# Patient Record
Sex: Female | Born: 1985 | Race: Black or African American | Hispanic: No | Marital: Married | Smoking: Current every day smoker
Health system: Southern US, Community
[De-identification: ages and names within clinical notes are randomized; demographics above are authoritative.]

## PROBLEM LIST (undated history)

## (undated) DIAGNOSIS — F32A Depression, unspecified: Secondary | ICD-10-CM

## (undated) DIAGNOSIS — F319 Bipolar disorder, unspecified: Secondary | ICD-10-CM

## (undated) DIAGNOSIS — F329 Major depressive disorder, single episode, unspecified: Secondary | ICD-10-CM

---
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
## 2007-08-28 ENCOUNTER — Emergency Department: Payer: Self-pay | Admitting: Emergency Medicine

## 2009-02-07 ENCOUNTER — Emergency Department: Payer: Self-pay | Admitting: Emergency Medicine

## 2009-04-14 ENCOUNTER — Emergency Department: Payer: Self-pay | Admitting: Emergency Medicine

## 2009-11-27 ENCOUNTER — Emergency Department: Payer: Self-pay | Admitting: Emergency Medicine

## 2010-05-04 ENCOUNTER — Emergency Department: Payer: Self-pay | Admitting: Emergency Medicine

## 2010-07-12 ENCOUNTER — Emergency Department: Payer: Self-pay | Admitting: Emergency Medicine

## 2010-11-27 ENCOUNTER — Emergency Department: Payer: Self-pay | Admitting: Emergency Medicine

## 2011-05-26 ENCOUNTER — Emergency Department: Payer: Self-pay | Admitting: Emergency Medicine

## 2011-06-30 ENCOUNTER — Emergency Department: Payer: Self-pay | Admitting: Emergency Medicine

## 2012-02-02 ENCOUNTER — Emergency Department: Payer: Self-pay | Admitting: Emergency Medicine

## 2012-03-14 ENCOUNTER — Ambulatory Visit: Payer: Self-pay

## 2012-03-14 LAB — CBC WITH DIFFERENTIAL/PLATELET
Basophil #: 0 10*3/uL (ref 0.0–0.1)
Basophil %: 0.6 %
Eosinophil #: 0.1 10*3/uL (ref 0.0–0.7)
Eosinophil %: 1.8 %
HCT: 37.9 % (ref 35.0–47.0)
Lymphocyte #: 2.1 10*3/uL (ref 1.0–3.6)
MCH: 26 pg (ref 26.0–34.0)
MCHC: 32.1 g/dL (ref 32.0–36.0)
MCV: 81 fL (ref 80–100)
Monocyte #: 0.6 x10 3/mm (ref 0.2–0.9)
Neutrophil #: 5 10*3/uL (ref 1.4–6.5)
Neutrophil %: 63.5 %
Platelet: 277 10*3/uL (ref 150–440)
RDW: 15.3 % — ABNORMAL HIGH (ref 11.5–14.5)

## 2012-03-14 LAB — COMPREHENSIVE METABOLIC PANEL
Albumin: 3.6 g/dL (ref 3.4–5.0)
Calcium, Total: 9.1 mg/dL (ref 8.5–10.1)
Chloride: 103 mmol/L (ref 98–107)
EGFR (African American): >60
EGFR (Non-African Amer.): >60
Osmolality: 274 (ref 275–301)
SGOT(AST): 13 U/L — ABNORMAL LOW (ref 15–37)
SGPT (ALT): 15 U/L
Sodium: 138 mmol/L (ref 136–145)

## 2013-10-29 ENCOUNTER — Emergency Department: Payer: Self-pay | Admitting: Emergency Medicine

## 2013-10-29 LAB — URINALYSIS, COMPLETE
BILIRUBIN, UR: NEGATIVE
BLOOD: NEGATIVE
Bacteria: NONE SEEN
Glucose,UR: NEGATIVE mg/dL (ref 0–75)
Ketone: NEGATIVE
LEUKOCYTE ESTERASE: NEGATIVE
Nitrite: NEGATIVE
PROTEIN: NEGATIVE
Ph: 6 (ref 4.5–8.0)
RBC,UR: <1 /HPF (ref 0–5)
Specific Gravity: 1.024 (ref 1.003–1.030)
Squamous Epithelial: 1

## 2013-10-29 LAB — CBC WITH DIFFERENTIAL/PLATELET
Basophil #: 0.1 10*3/uL (ref 0.0–0.1)
Basophil %: 0.6 %
EOS PCT: 1.3 %
Eosinophil #: 0.1 10*3/uL (ref 0.0–0.7)
HCT: 39.9 % (ref 35.0–47.0)
HGB: 12.9 g/dL (ref 12.0–16.0)
LYMPHS ABS: 2.1 10*3/uL (ref 1.0–3.6)
Lymphocyte %: 24.6 %
MCH: 25.4 pg — AB (ref 26.0–34.0)
MCHC: 32.3 g/dL (ref 32.0–36.0)
MCV: 79 fL — AB (ref 80–100)
MONOS PCT: 7.4 %
Monocyte #: 0.6 x10 3/mm (ref 0.2–0.9)
Neutrophil #: 5.7 10*3/uL (ref 1.4–6.5)
Neutrophil %: 66.1 %
Platelet: 309 10*3/uL (ref 150–440)
RBC: 5.06 10*6/uL (ref 3.80–5.20)
RDW: 15.8 % — AB (ref 11.5–14.5)
WBC: 8.6 10*3/uL (ref 3.6–11.0)

## 2013-10-29 LAB — COMPREHENSIVE METABOLIC PANEL
ALT: 15 U/L (ref 12–78)
ANION GAP: 4 — AB (ref 7–16)
Albumin: 3.9 g/dL (ref 3.4–5.0)
Alkaline Phosphatase: 90 U/L
BUN: 7 mg/dL (ref 7–18)
Bilirubin,Total: 0.2 mg/dL (ref 0.2–1.0)
CALCIUM: 9.1 mg/dL (ref 8.5–10.1)
Chloride: 105 mmol/L (ref 98–107)
Co2: 28 mmol/L (ref 21–32)
Creatinine: 0.95 mg/dL (ref 0.60–1.30)
Glucose: 80 mg/dL (ref 65–99)
Osmolality: 271 (ref 275–301)
Potassium: 3.9 mmol/L (ref 3.5–5.1)
SGOT(AST): 10 U/L — ABNORMAL LOW (ref 15–37)
Sodium: 137 mmol/L (ref 136–145)
Total Protein: 7.9 g/dL (ref 6.4–8.2)

## 2014-06-26 ENCOUNTER — Emergency Department: Payer: Self-pay | Admitting: Emergency Medicine

## 2014-06-26 LAB — URINALYSIS, COMPLETE
Bilirubin,UR: NEGATIVE
Glucose,UR: NEGATIVE mg/dL (ref 0–75)
KETONE: NEGATIVE
NITRITE: POSITIVE
PH: 5 (ref 4.5–8.0)
RBC,UR: 127 /HPF (ref 0–5)
Specific Gravity: 1.016 (ref 1.003–1.030)
WBC UR: 202 /HPF (ref 0–5)

## 2014-09-30 ENCOUNTER — Emergency Department: Payer: Self-pay | Admitting: Emergency Medicine

## 2014-10-01 LAB — CBC
HCT: 39.1 % (ref 35.0–47.0)
HGB: 12.6 g/dL (ref 12.0–16.0)
MCH: 26.1 pg (ref 26.0–34.0)
MCHC: 32.4 g/dL (ref 32.0–36.0)
MCV: 81 fL (ref 80–100)
Platelet: 301 10*3/uL (ref 150–440)
RBC: 4.85 10*6/uL (ref 3.80–5.20)
RDW: 15.5 % — AB (ref 11.5–14.5)
WBC: 10.1 10*3/uL (ref 3.6–11.0)

## 2014-10-01 LAB — URINALYSIS, COMPLETE
BLOOD: NEGATIVE
Bacteria: NONE SEEN
Bilirubin,UR: NEGATIVE
GLUCOSE, UR: NEGATIVE mg/dL (ref 0–75)
Ketone: NEGATIVE
Leukocyte Esterase: NEGATIVE
Nitrite: NEGATIVE
PROTEIN: NEGATIVE
Ph: 6 (ref 4.5–8.0)
RBC,UR: 1 /HPF (ref 0–5)
Specific Gravity: 1.009 (ref 1.003–1.030)
Squamous Epithelial: 2

## 2014-10-01 LAB — COMPREHENSIVE METABOLIC PANEL
ALT: 17 U/L
AST: 20 U/L (ref 15–37)
Albumin: 3.3 g/dL — ABNORMAL LOW (ref 3.4–5.0)
Alkaline Phosphatase: 82 U/L
Anion Gap: 5 — ABNORMAL LOW (ref 7–16)
BILIRUBIN TOTAL: 0.1 mg/dL — AB (ref 0.2–1.0)
BUN: 9 mg/dL (ref 7–18)
CO2: 28 mmol/L (ref 21–32)
CREATININE: 0.84 mg/dL (ref 0.60–1.30)
Calcium, Total: 9.1 mg/dL (ref 8.5–10.1)
Chloride: 107 mmol/L (ref 98–107)
EGFR (Non-African Amer.): >60
Glucose: 89 mg/dL (ref 65–99)
Osmolality: 278 (ref 275–301)
POTASSIUM: 4 mmol/L (ref 3.5–5.1)
Sodium: 140 mmol/L (ref 136–145)
TOTAL PROTEIN: 7.1 g/dL (ref 6.4–8.2)

## 2014-10-01 LAB — TROPONIN I: Troponin-I: <0.02 ng/mL

## 2015-09-19 ENCOUNTER — Emergency Department
Admission: EM | Admit: 2015-09-19 | Discharge: 2015-09-19 | Disposition: A | Discharge status: Home / Self Care | Payer: Self-pay | Attending: Emergency Medicine | Admitting: Emergency Medicine

## 2015-09-19 ENCOUNTER — Encounter: Payer: Self-pay | Admitting: Emergency Medicine

## 2015-09-19 DIAGNOSIS — Z79899 Other long term (current) drug therapy: Secondary | ICD-10-CM | POA: Insufficient documentation

## 2015-09-19 DIAGNOSIS — Z880 Allergy status to penicillin: Secondary | ICD-10-CM | POA: Insufficient documentation

## 2015-09-19 DIAGNOSIS — R1013 Epigastric pain: Secondary | ICD-10-CM | POA: Insufficient documentation

## 2015-09-19 DIAGNOSIS — R112 Nausea with vomiting, unspecified: Secondary | ICD-10-CM | POA: Insufficient documentation

## 2015-09-19 DIAGNOSIS — R197 Diarrhea, unspecified: Secondary | ICD-10-CM | POA: Insufficient documentation

## 2015-09-19 DIAGNOSIS — F17210 Nicotine dependence, cigarettes, uncomplicated: Secondary | ICD-10-CM | POA: Insufficient documentation

## 2015-09-19 HISTORY — DX: Bipolar disorder, unspecified: F31.9

## 2015-09-19 HISTORY — DX: Depression, unspecified: F32.A

## 2015-09-19 HISTORY — DX: Major depressive disorder, single episode, unspecified: F32.9

## 2015-09-19 LAB — LIPASE, BLOOD: Lipase: 22 U/L (ref 11–51)

## 2015-09-19 LAB — COMPREHENSIVE METABOLIC PANEL
ALBUMIN: 4.3 g/dL (ref 3.5–5.0)
ALT: 11 U/L — ABNORMAL LOW (ref 14–54)
AST: 15 U/L (ref 15–41)
Alkaline Phosphatase: 75 U/L (ref 38–126)
Anion gap: 7 (ref 5–15)
BILIRUBIN TOTAL: 0.4 mg/dL (ref 0.3–1.2)
BUN: 16 mg/dL (ref 6–20)
CO2: 27 mmol/L (ref 22–32)
Calcium: 9.4 mg/dL (ref 8.9–10.3)
Chloride: 105 mmol/L (ref 101–111)
Creatinine, Ser: 0.96 mg/dL (ref 0.44–1.00)
GFR calc Af Amer: >60 mL/min (ref >60)
GFR calc non Af Amer: >60 mL/min (ref >60)
GLUCOSE: 106 mg/dL — AB (ref 65–99)
POTASSIUM: 4.7 mmol/L (ref 3.5–5.1)
Sodium: 139 mmol/L (ref 135–145)
TOTAL PROTEIN: 7.9 g/dL (ref 6.5–8.1)

## 2015-09-19 LAB — URINALYSIS COMPLETE WITH MICROSCOPIC (ARMC ONLY)
BACTERIA UA: NONE SEEN
Bilirubin Urine: NEGATIVE
GLUCOSE, UA: NEGATIVE mg/dL
Hgb urine dipstick: NEGATIVE
Leukocytes, UA: NEGATIVE
NITRITE: NEGATIVE
Protein, ur: NEGATIVE mg/dL
Specific Gravity, Urine: 1.03 (ref 1.005–1.030)
pH: 5 (ref 5.0–8.0)

## 2015-09-19 LAB — CBC
HEMATOCRIT: 41.5 % (ref 35.0–47.0)
Hemoglobin: 13.2 g/dL (ref 12.0–16.0)
MCH: 24.5 pg — ABNORMAL LOW (ref 26.0–34.0)
MCHC: 31.8 g/dL — AB (ref 32.0–36.0)
MCV: 77 fL — AB (ref 80.0–100.0)
Platelets: 371 10*3/uL (ref 150–440)
RBC: 5.39 MIL/uL — ABNORMAL HIGH (ref 3.80–5.20)
RDW: 16 % — AB (ref 11.5–14.5)
WBC: 14.6 10*3/uL — ABNORMAL HIGH (ref 3.6–11.0)

## 2015-09-19 MED ORDER — KETOROLAC TROMETHAMINE 60 MG/2ML IM SOLN
INTRAMUSCULAR | Status: AC
  Administered 2015-09-19: 60 mg via INTRAMUSCULAR
  Filled 2015-09-19: qty 2

## 2015-09-19 MED ORDER — LOPERAMIDE HCL 2 MG PO TABS: 2.0000 mg | ORAL_TABLET | Freq: Four times a day (QID) | ORAL | Status: AC | PRN

## 2015-09-19 MED ORDER — DIPHENOXYLATE-ATROPINE 2.5-0.025 MG PO TABS
ORAL_TABLET | ORAL | Status: AC
  Administered 2015-09-19: 1 via ORAL
  Filled 2015-09-19: qty 1

## 2015-09-19 MED ORDER — DIPHENOXYLATE-ATROPINE 2.5-0.025 MG PO TABS
1.0000 | ORAL_TABLET | Freq: Once | ORAL | Status: AC
  Administered 2015-09-19: 1 via ORAL

## 2015-09-19 MED ORDER — ONDANSETRON 4 MG PO TBDP
8.0000 mg | ORAL_TABLET | Freq: Once | ORAL | Status: AC
  Administered 2015-09-19: 8 mg via ORAL

## 2015-09-19 MED ORDER — KETOROLAC TROMETHAMINE 60 MG/2ML IM SOLN
60.0000 mg | Freq: Once | INTRAMUSCULAR | Status: AC
  Administered 2015-09-19: 60 mg via INTRAMUSCULAR

## 2015-09-19 MED ORDER — ONDANSETRON 4 MG PO TBDP
ORAL_TABLET | ORAL | Status: AC
  Filled 2015-09-19: qty 1

## 2015-09-19 MED ORDER — ONDANSETRON 4 MG PO TBDP: 4.0000 mg | ORAL_TABLET | Freq: Three times a day (TID) | ORAL | Status: AC | PRN

## 2015-09-19 MED ORDER — ONDANSETRON 4 MG PO TBDP
4.0000 mg | ORAL_TABLET | Freq: Once | ORAL | Status: AC | PRN
  Administered 2015-09-19: 4 mg via ORAL

## 2015-09-19 MED ORDER — ONDANSETRON 8 MG PO TBDP
ORAL_TABLET | ORAL | Status: AC
  Administered 2015-09-19: 8 mg via ORAL
  Filled 2015-09-19: qty 1

## 2015-09-19 NOTE — Discharge Instructions (Signed)
Please take a clear liquid diet for the next 24-48 hours, then advance to bland BRAT diet as tolerated. Please drink plenty of fluid to stay well-hydrated. You may use Zofran for nausea and vomiting. Loperamide as for diarrhea. You may take Tylenol or Motrin for fever and pain.  Please return to the emergency department if you develop severe pain especially if it is in one location, inability to keep down fluids, fever, fainting or lightheadedness, or any other symptoms concerning to you.  Abdominal Pain, Adult Many things can cause abdominal pain. Usually, abdominal pain is not caused by a disease and will improve without treatment. It can often be observed and treated at home. Your health care provider will do a physical exam and possibly order blood tests and X-rays to help determine the seriousness of your pain. However, in many cases, more time must pass before a clear cause of the pain can be found. Before that point, your health care provider may not know if you need more testing or further treatment. HOME CARE INSTRUCTIONS Monitor your abdominal pain for any changes. The following actions may help to alleviate any discomfort you are experiencing:  Only take over-the-counter or prescription medicines as directed by your health care provider.  Do not take laxatives unless directed to do so by your health care provider.  Try a clear liquid diet (broth, tea, or water) as directed by your health care provider. Slowly move to a bland diet as tolerated. SEEK MEDICAL CARE IF:  You have unexplained abdominal pain.  You have abdominal pain associated with nausea or diarrhea.  You have pain when you urinate or have a bowel movement.  You experience abdominal pain that wakes you in the night.  You have abdominal pain that is worsened or improved by eating food.  You have abdominal pain that is worsened with eating fatty foods.  You have a fever. SEEK IMMEDIATE MEDICAL CARE IF:  Your pain  does not go away within 2 hours.  You keep throwing up (vomiting).  Your pain is felt only in portions of the abdomen, such as the right side or the left lower portion of the abdomen.  You pass bloody or black tarry stools. MAKE SURE YOU:  Understand these instructions.  Will watch your condition.  Will get help right away if you are not doing well or get worse.   This information is not intended to replace advice given to you by your health care provider. Make sure you discuss any questions you have with your health care provider.   Document Released: 07/05/2005 Document Revised: 06/16/2015 Document Reviewed: 06/04/2013 Elsevier Interactive Patient Education [employer]2016 Elsevier Inc.

## 2015-09-19 NOTE — ED Notes (Signed)
AAOx3.  Skin warm and dry. NAD.  Ambulates with easy and steady gait.   

## 2015-09-19 NOTE — ED Notes (Addendum)
Pt says she began having diarrhea around 1pm today; 6 liquid stools since; vomited once around 3pm; pt says she had a fever but never checked temp at home; c/o upper abdominal pain; pt ambulatory with steady gait; talking in complete coherent sentences; pt here with 2 others from same household who have also checked in to be seen

## 2015-09-19 NOTE — ED Notes (Signed)
Taking PO well.  No N/V.

## 2015-09-19 NOTE — ED Provider Notes (Signed)
[facility] Emergency Department Provider Note  ____________________________________________  Time seen: Approximately 9:11 PM  I have reviewed the triage vital signs and the nursing notes.   HISTORY  Chief Complaint Emesis and Diarrhea    HPI Latoya Malone is a 29 y.o. female presenting with nausea vomiting and diarrhea, epigastric pain. Patient states that she has multiple family members with similar symptoms. This afternoon she began to have loose stool that was nonbloody and a single episode of vomiting. She continues to have nausea. After the vomiting, she developed some epigastric pain. No urinary symptoms, change in vaginal discharge. Felt hot at home but afebrile here.   Past Medical History  Diagnosis Date  . Depression   . Bipolar 1 disorder (HCC)     There are no active problems to display for this patient.   History reviewed. No pertinent past surgical history.  Current Outpatient Rx  Name  Route  Sig  Dispense  Refill  . traZODone (DESYREL) 50 MG tablet   Oral   Take 50 mg by mouth at bedtime.         . traZODone (DESYREL) 50 MG tablet   Oral   Take 25 mg by mouth 2 (two) times daily. In the morning and late afternoon         . loperamide (IMODIUM A-D) 2 MG tablet   Oral   Take 1 tablet (2 mg total) by mouth 4 (four) times daily as needed for diarrhea or loose stools.   15 tablet   0   . ondansetron (ZOFRAN ODT) 4 MG disintegrating tablet   Oral   Take 1 tablet (4 mg total) by mouth every 8 (eight) hours as needed for nausea or vomiting.   20 tablet   0     Allergies Amoxicillin and Penicillins  History reviewed. No pertinent family history.  Social History Social History  Substance Use Topics  . Smoking status: Current Every Day Smoker -- 0.50 packs/day    Types: Cigarettes  . Smokeless tobacco: None  . Alcohol Use: Yes     Comment: occasional    Review of Systems Constitutional: Subjective fever. No  chills. No lightheadedness or syncope. Eyes: No visual changes. ENT: No sore throat. Cardiovascular: Denies chest pain, palpitations. Respiratory: Denies shortness of breath.  No cough. Gastrointestinal: Positive epigastric abdominal pain.  Positive nausea, positive vomiting.  Positive diarrhea.  No constipation. Genitourinary: Negative for dysuria. No change in vaginal discharge. Musculoskeletal: Negative for back pain. Skin: Negative for rash. Neurological: Negative for headaches, focal weakness or numbness.  10-point ROS otherwise negative.  ____________________________________________   PHYSICAL EXAM:  VITAL SIGNS: ED Triage Vitals  Enc Vitals Group     BP --      Pulse --      Resp 09/19/15 2028 18     Temp 09/19/15 2028 98.7 F (37.1 C)     Temp Source 09/19/15 2028 Oral     SpO2 --      Weight 09/19/15 2028 280 lb (127.007 kg)     Height 09/19/15 2028  (1.626 m)     Head Cir --      Peak Flow --      Pain Score 09/19/15 2029 8     Pain Loc --      Pain Edu? --      Excl. in GC? --     Constitutional: Alert and oriented. Well appearing and in no acute distress. Answer question appropriately.  Eyes: Conjunctivae are normal.  EOMI. no scleral icterus. Head: Atraumatic. Nose: No congestion/rhinnorhea. Mouth/Throat: Mucous membranes are moist.  Neck: No stridor.  Supple.  No meningismus. Cardiovascular: Normal rate, regular rhythm. No murmurs, rubs or gallops.  Respiratory: Normal respiratory effort.  No retractions. Lungs CTAB.  No wheezes, rales or ronchi. Gastrointestinal: Abdomen is obese, soft and nondistended. Patient has minimal tenderness in the epigastric area. No right upper quadrant pain, negative Murphy sign. No peritoneal signs, no guarding or rebound. Musculoskeletal: No LE edema.  Neurologic:  Normal speech and language. No gross focal neurologic deficits are appreciated.  Skin:  Skin is warm, dry and intact. No rash noted. No  jaundice Psychiatric: Mood and affect are normal. Speech and behavior are normal.  Normal judgement.  ____________________________________________   LABS (all labs ordered are listed, but only abnormal results are displayed)  Labs Reviewed  COMPREHENSIVE METABOLIC PANEL - Abnormal; Notable for the following:    Glucose, Bld 106 (*)    ALT 11 (*)    All other components within normal limits  CBC - Abnormal; Notable for the following:    WBC 14.6 (*)    RBC 5.39 (*)    MCV 77.0 (*)    MCH 24.5 (*)    MCHC 31.8 (*)    RDW 16.0 (*)    All other components within normal limits  URINALYSIS COMPLETEWITH MICROSCOPIC ([facility] ONLY) - Abnormal; Notable for the following:    Color, Urine YELLOW (*)    APPearance CLEAR (*)    Ketones, ur TRACE (*)    Squamous Epithelial / LPF 0-5 (*)    All other components within normal limits  LIPASE, BLOOD   ____________________________________________  EKG  Not indicated ____________________________________________  RADIOLOGY  No results found.  ____________________________________________   PROCEDURES  Procedure(s) performed: None  Critical Care performed: No ____________________________________________   INITIAL IMPRESSION / ASSESSMENT AND PLAN / ED COURSE  Pertinent labs & imaging results that were available during my care of the patient were reviewed by me and considered in my medical decision making (see chart for details).  29 y.o. female with multiple close family members with GI illness presenting with nausea, vomiting, diarrhea, and mild epigastric pain. Overall, the patient is nontoxic with stable vital signs. She is afebrile here. The most likely etiology of her symptoms is no acute viral GI illness. She does not have a urinary tract infection and her electrolytes are well maintained. Her LFTs are also normal. At this time, acute surgical pathology is much less likely. Gallbladder disease, early appendectomy are also on the  differential and I have given her return precautions if she develops pain or worsening symptoms.  ----------------------------------------- 9:34 PM on 09/19/2015 -----------------------------------------  The patient's nausea has completely resolved, and her pain has significantly improved. She was able to tolerate by mouth so we will plan discharge with close PMD follow-up ____________________________________________  FINAL CLINICAL IMPRESSION(S) / ED DIAGNOSES  Final diagnoses:  Nausea vomiting and diarrhea  Epigastric pain      NEW MEDICATIONS STARTED DURING THIS VISIT:  New Prescriptions   LOPERAMIDE (IMODIUM A-D) 2 MG TABLET    Take 1 tablet (2 mg total) by mouth 4 (four) times daily as needed for diarrhea or loose stools.   ONDANSETRON (ZOFRAN ODT) 4 MG DISINTEGRATING TABLET    Take 1 tablet (4 mg total) by mouth every 8 (eight) hours as needed for nausea or vomiting.     Rockne MenghiniAnne-Caroline Zoiee Wimmer, MD 09/19/15 2134

## 2015-09-19 NOTE — ED Notes (Signed)
Taking sips of water

## 2015-11-23 ENCOUNTER — Encounter: Payer: Self-pay | Admitting: *Deleted

## 2015-11-23 ENCOUNTER — Ambulatory Visit
Admission: EM | Admit: 2015-11-23 | Discharge: 2015-11-23 | Disposition: A | Discharge status: Home / Self Care | Payer: Worker's Compensation | Attending: Family Medicine | Admitting: Family Medicine

## 2015-11-23 DIAGNOSIS — S0502XA Injury of conjunctiva and corneal abrasion without foreign body, left eye, initial encounter: Secondary | ICD-10-CM | POA: Diagnosis not present

## 2015-11-23 MED ORDER — MOXIFLOXACIN HCL 0.5 % OP SOLN: 1.0000 [drp] | Freq: Three times a day (TID) | OPHTHALMIC | Status: AC

## 2015-11-23 NOTE — ED Provider Notes (Signed)
CSN: [id number]     Arrival date & time 11/23/15  4098 History   First MD Initiated Contact with Patient 11/23/15 1126     Chief Complaint  Patient presents with  . Eye Injury   (Consider location/radiation/quality/duration/timing/severity/associated sxs/prior Treatment) HPI Comments: 30 yo female with a c/o left eye discomfort after work injury today around 2am. States as a co-worker was blow cleaning a machine, pieces of metal and dust got in her left eye. Patient was wearing work [employer], [employer], however material flew in from above the safety glasses. States she received help at work with irrigation of the eye and 3 small pieces of metal came out with the irrigation.   Patient is a 30 y.o. female presenting with eye injury. The history is provided by the patient.  Eye Injury This is a new problem. The current episode started 6 to 12 hours ago.    Past Medical History  Diagnosis Date  . Depression   . Bipolar 1 disorder (HCC)    History reviewed. No pertinent past surgical history. History reviewed. No pertinent family history. Social History  Substance Use Topics  . Smoking status: Current Every Day Smoker -- 0.50 packs/day    Types: Cigarettes  . Smokeless tobacco: None  . Alcohol Use: Yes     Comment: occasional   OB History    No data available     Review of Systems  Allergies  Amoxicillin and Penicillins  Home Medications   Prior to Admission medications   Medication Sig Start Date End Date Taking? Authorizing Provider  traZODone (DESYREL) 50 MG tablet Take 50 mg by mouth at bedtime.   Yes Historical Provider, MD  traZODone (DESYREL) 50 MG tablet Take 25 mg by mouth 2 (two) times daily. In the morning and late afternoon   Yes Historical Provider, MD  loperamide (IMODIUM A-D) 2 MG tablet Take 1 tablet (2 mg total) by mouth 4 (four) times daily as needed for diarrhea or loose stools. 09/19/15   Anne-Caroline Sharma Covert, MD  moxifloxacin (VIGAMOX) 0.5 % ophthalmic  solution Place 1 drop into the left eye 3 (three) times daily. 11/23/15   Payton Mccallum, MD  ondansetron (ZOFRAN ODT) 4 MG disintegrating tablet Take 1 tablet (4 mg total) by mouth every 8 (eight) hours as needed for nausea or vomiting. 09/19/15   Rockne Menghini, MD   Meds Ordered and Administered this Visit  Medications - No data to display  BP 131/67 mmHg  Pulse 83  Temp(Src) 98.1 F (36.7 C) (Oral)  Resp 16  Ht  (1.753 m)  Wt 260 lb (117.935 kg)  BMI 38.38 kg/m2  SpO2 100%  LMP 10/11/2015 (Exact Date) No data found.   Physical Exam  Constitutional: She appears well-developed and well-nourished. No distress.  Eyes: EOM and lids are normal. Pupils are equal, round, and reactive to light. Lids are everted and swept, no foreign bodies found. Right eye exhibits no discharge. Left eye exhibits no discharge and no exudate. No foreign body present in the left eye. Left conjunctiva is injected.  Slit lamp exam:      The left eye shows fluorescein uptake.    Skin: She is not diaphoretic.  Vitals reviewed.   ED Course  Procedures (including critical care time)  Labs Review Labs Reviewed - No data to display  Imaging Review No results found.   Visual Acuity Review  Right Eye Distance: 20/20 corrected Left Eye Distance: 20/20 corrected Bilateral Distance: 20/20 corrected  Right Eye  Near:   Left Eye Near:    Bilateral Near:         MDM   1. Corneal abrasion, left, initial encounter    Discharge Medication List as of 11/23/2015 11:54 AM    START taking these medications   Details  moxifloxacin (VIGAMOX) 0.5 % ophthalmic solution Place 1 drop into the left eye 3 (three) times daily., Starting 11/23/2015, Until Discontinued, Print       1. diagnosis reviewed with patient 2. rx as per orders above; reviewed possible side effects, interactions, risks and benefits  3. Recommend supportive treatment with otc analgesics, cool compresses 4. May return to  regular job/full duty 5. Follow-up prn at [facility] [place].) Occupational Health   Payton Mccallum, MD 11/23/15 [phone]

## 2015-11-23 NOTE — ED Notes (Signed)
While at work this am, pt had metal chip blown into left eye. Pt was wearing safety glasses. Pt denies vision difficulty or pain.

## 2015-11-23 NOTE — Discharge Instructions (Signed)

## 2020-02-28 ENCOUNTER — Emergency Department
Admission: EM | Admit: 2020-02-28 | Discharge: 2020-02-28 | Disposition: A | Discharge status: Home / Self Care | Payer: PRIVATE HEALTH INSURANCE | Attending: Emergency Medicine | Admitting: Emergency Medicine

## 2020-02-28 ENCOUNTER — Other Ambulatory Visit: Payer: Self-pay

## 2020-02-28 ENCOUNTER — Emergency Department: Payer: PRIVATE HEALTH INSURANCE

## 2020-02-28 ENCOUNTER — Encounter: Payer: Self-pay | Admitting: Emergency Medicine

## 2020-02-28 DIAGNOSIS — M542 Cervicalgia: Secondary | ICD-10-CM | POA: Insufficient documentation

## 2020-02-28 DIAGNOSIS — R519 Headache, unspecified: Secondary | ICD-10-CM | POA: Insufficient documentation

## 2020-02-28 DIAGNOSIS — Y9389 Activity, other specified: Secondary | ICD-10-CM | POA: Insufficient documentation

## 2020-02-28 DIAGNOSIS — W2209XA Striking against other stationary object, initial encounter: Secondary | ICD-10-CM | POA: Insufficient documentation

## 2020-02-28 DIAGNOSIS — S0093XA Contusion of unspecified part of head, initial encounter: Secondary | ICD-10-CM

## 2020-02-28 DIAGNOSIS — Y92099 Unspecified place in other non-institutional residence as the place of occurrence of the external cause: Secondary | ICD-10-CM | POA: Insufficient documentation

## 2020-02-28 DIAGNOSIS — F17210 Nicotine dependence, cigarettes, uncomplicated: Secondary | ICD-10-CM | POA: Insufficient documentation

## 2020-02-28 DIAGNOSIS — Y999 Unspecified external cause status: Secondary | ICD-10-CM | POA: Insufficient documentation

## 2020-02-28 DIAGNOSIS — S0083XA Contusion of other part of head, initial encounter: Secondary | ICD-10-CM | POA: Insufficient documentation

## 2020-02-28 MED ORDER — ACETAMINOPHEN 500 MG PO TABS
1000.0000 mg | ORAL_TABLET | Freq: Once | ORAL | Status: AC
  Administered 2020-02-28: 1000 mg via ORAL
  Filled 2020-02-28: qty 2

## 2020-02-28 NOTE — ED Provider Notes (Signed)
Emergency Department Provider Note  ____________________________________________  Time seen: Approximately 4:26 PM  I have reviewed the triage vital signs and the nursing notes.   HISTORY  Chief Complaint Head Injury and Eye Pain   Historian Patient     HPI Latoya Malone is a 34 y.o. female presents to the emergency department after patient accidentally hit her head against the fence panel mechanical.  Patient struck the left side of her forehead.  She states that she currently has a right-sided headache.  No nausea or vomiting.  No current pain with extraocular eye muscle movement.  Triage note noted.  She did state that she had some neck stiffness after injury occurred.  She denies loss of consciousness.  No numbness or tingling in the upper and lower extremities.  Patient has been able to ambulate easily since injury occurred.   Past Medical History:  Diagnosis Date  . Bipolar 1 disorder ([place])   . Depression      Immunizations up to date:  Yes.     Past Medical History:  Diagnosis Date  . Bipolar 1 disorder ([place])   . Depression     There are no problems to display for this patient.   History reviewed. No pertinent surgical history.  Prior to Admission medications   Medication Sig Start Date End Date Taking? Authorizing Provider  loperamide (IMODIUM A-D) 2 MG tablet Take 1 tablet (2 mg total) by mouth 4 (four) times daily as needed for diarrhea or loose stools. 09/19/15   Eula Listen, MD  moxifloxacin (VIGAMOX) 0.5 % ophthalmic solution Place 1 drop into the left eye 3 (three) times daily. 11/23/15   Norval Gable, MD  ondansetron (ZOFRAN ODT) 4 MG disintegrating tablet Take 1 tablet (4 mg total) by mouth every 8 (eight) hours as needed for nausea or vomiting. 09/19/15   Eula Listen, MD  traZODone (DESYREL) 50 MG tablet Take 50 mg by mouth at bedtime.    [provider]  traZODone (DESYREL) 50 MG tablet Take 25 mg by mouth 2 (two)  times daily. In the morning and late afternoon    [provider]    Allergies Amoxicillin and Penicillins  No family history on file.  Social History Social History   Tobacco Use  . Smoking status: Current Every Day Smoker    Packs/day: 0.50    Types: Cigarettes  . Smokeless tobacco: Never Used  Substance Use Topics  . Alcohol use: Yes    Comment: occasional  . Drug use: No     Review of Systems  Constitutional: No fever/chills Eyes:  No discharge ENT: No upper respiratory complaints. Respiratory: no cough. No SOB/ use of accessory muscles to breath Gastrointestinal:   No nausea, no vomiting.  No diarrhea.  No constipation. Musculoskeletal: Negative for musculoskeletal pain. Neuro: Patient has headache.  Skin: Negative for rash, abrasions, lacerations, ecchymosis.    ____________________________________________   PHYSICAL EXAM:  VITAL SIGNS: ED Triage Vitals  Enc Vitals Group     BP 02/28/20 1502 135/70     Pulse Rate 02/28/20 1502 96     Resp 02/28/20 1502 18     Temp 02/28/20 1502 98.6 F (37 C)     Temp Source 02/28/20 1502 Oral     SpO2 02/28/20 1502 98 %     Weight 02/28/20 1459 250 lb (113.4 kg)     Height 02/28/20 1459 5' 4.5" (1.638 m)     Head Circumference --      Peak  Flow --      Pain Score 02/28/20 1459 8     Pain Loc --      Pain Edu? --      Excl. in GC? --      Constitutional: Alert and oriented. Well appearing and in no acute distress. Eyes: Conjunctivae are normal. PERRL. EOMI. Head: Atraumatic. ENT:      Ears: TMs are pearly.       Nose: No congestion/rhinnorhea.      Mouth/Throat: Mucous membranes are moist.  Neck: No stridor.  Full range of motion.  No midline C-spine tenderness to palpation.  Cardiovascular: Normal rate, regular rhythm. Normal S1 and S2.  Good peripheral circulation. Respiratory: Normal respiratory effort without tachypnea or retractions. Lungs CTAB. Good air entry to the bases with no decreased or  absent breath sounds Gastrointestinal: Bowel sounds x 4 quadrants. Soft and nontender to palpation. No guarding or rigidity. No distention. Musculoskeletal: Full range of motion to all extremities. No obvious deformities noted Neurologic:  Normal for age. No gross focal neurologic deficits are appreciated.  Skin:  Skin is warm, dry and intact. No rash noted. Psychiatric: Mood and affect are normal for age. Speech and behavior are normal.   ____________________________________________   LABS (all labs ordered are listed, but only abnormal results are displayed)  Labs Reviewed - No data to display ____________________________________________  EKG   ____________________________________________  RADIOLOGY Geraldo Pitter, personally viewed and evaluated these images (plain radiographs) as part of my medical decision making, as well as reviewing the written report by the radiologist.     CT Head Wo Contrast  Result Date: 02/28/2020 CLINICAL DATA:  Post trauma to the left side of the head. EXAM: CT HEAD WITHOUT CONTRAST CT CERVICAL SPINE WITHOUT CONTRAST TECHNIQUE: Multidetector CT imaging of the head and cervical spine was performed following the standard protocol without intravenous contrast. Multiplanar CT image reconstructions of the cervical spine were also generated. COMPARISON:  October 01, 2014 FINDINGS: CT HEAD FINDINGS Brain: No evidence of acute infarction, hemorrhage, hydrocephalus, extra-axial collection or mass lesion/mass effect. Vascular: No hyperdense vessel or unexpected calcification. Skull: Normal. Negative for fracture or focal lesion. Sinuses/Orbits: No acute finding. Other: None. CT CERVICAL SPINE FINDINGS Alignment: Normal. Skull base and vertebrae: No acute fracture. No primary bone lesion or focal pathologic process. Soft tissues and spinal canal: No prevertebral fluid or swelling. No visible canal hematoma. Disc levels:  Normal. Upper chest: Negative. Other: None.  IMPRESSION: 1. No acute intracranial abnormality. 2. No evidence of acute traumatic injury to cervical spine. Electronically Signed   By: Ted Mcalpine M.D.   On: 02/28/2020 17:32   CT Cervical Spine Wo Contrast  Result Date: 02/28/2020 CLINICAL DATA:  Post trauma to the left side of the head. EXAM: CT HEAD WITHOUT CONTRAST CT CERVICAL SPINE WITHOUT CONTRAST TECHNIQUE: Multidetector CT imaging of the head and cervical spine was performed following the standard protocol without intravenous contrast. Multiplanar CT image reconstructions of the cervical spine were also generated. COMPARISON:  October 01, 2014 FINDINGS: CT HEAD FINDINGS Brain: No evidence of acute infarction, hemorrhage, hydrocephalus, extra-axial collection or mass lesion/mass effect. Vascular: No hyperdense vessel or unexpected calcification. Skull: Normal. Negative for fracture or focal lesion. Sinuses/Orbits: No acute finding. Other: None. CT CERVICAL SPINE FINDINGS Alignment: Normal. Skull base and vertebrae: No acute fracture. No primary bone lesion or focal pathologic process. Soft tissues and spinal canal: No prevertebral fluid or swelling. No visible canal hematoma. Disc levels:  Normal.  Upper chest: Negative. Other: None. IMPRESSION: 1. No acute intracranial abnormality. 2. No evidence of acute traumatic injury to cervical spine. Electronically Signed   By: Ted Mcalpine M.D.   On: 02/28/2020 17:32    ____________________________________________    PROCEDURES  Procedure(s) performed:     Procedures     Medications  acetaminophen (TYLENOL) tablet 1,000 mg (1,000 mg Oral Given 02/28/20 1633)     ____________________________________________   INITIAL IMPRESSION / ASSESSMENT AND PLAN / ED COURSE  Pertinent labs & imaging results that were available during my care of the patient were reviewed by me and considered in my medical decision making (see chart for details).      Assessment and Plan:   Headache 34 year old female presents to the emergency department with headache after patient hit her head against the metal post at her home.  Vital signs are reassuring at triage.  Neuro exam was without acute deficits.  CT head and CT cervical spine revealed no evidence of intracranial bleed, skull fracture or C-spine fracture.  Patient was given Tylenol in the emergency department for headache.  Return precautions were given to return with new or worsening symptoms.  ____________________________________________  FINAL CLINICAL IMPRESSION(S) / ED DIAGNOSES  Final diagnoses:  Contusion of head, unspecified part of head, initial encounter      NEW MEDICATIONS STARTED DURING THIS VISIT:  ED Discharge Orders    None          This chart was dictated using voice recognition software/Dragon. Despite best efforts to proofread, errors can occur which can change the meaning. Any change was purely unintentional.     Orvil Feil, PA-C 02/28/20 1828    Concha Se, MD 02/29/20 [phone]
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
## 2020-02-28 NOTE — ED Notes (Signed)
See triage note- pt reports hitting left side of head today on a metal fence. Pt complaining of 8/10 pain, reports some "numbness' but no dizziness or nausea. Denies blurred vision.
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
## 2020-10-21 ENCOUNTER — Emergency Department (HOSPITAL_COMMUNITY): Payer: Self-pay

## 2020-10-21 ENCOUNTER — Emergency Department (HOSPITAL_COMMUNITY)
Admission: EM | Admit: 2020-10-21 | Discharge: 2020-10-21 | Disposition: A | Discharge status: Home / Self Care | Payer: Self-pay | Attending: Emergency Medicine | Admitting: Emergency Medicine

## 2020-10-21 ENCOUNTER — Emergency Department (HOSPITAL_COMMUNITY)
Admission: EM | Admit: 2020-10-21 | Discharge: 2020-10-21 | Disposition: A | Discharge status: Home / Self Care | Payer: No Typology Code available for payment source

## 2020-10-21 ENCOUNTER — Encounter (HOSPITAL_COMMUNITY): Payer: Self-pay

## 2020-10-21 DIAGNOSIS — Z5321 Procedure and treatment not carried out due to patient leaving prior to being seen by health care provider: Secondary | ICD-10-CM | POA: Insufficient documentation

## 2020-10-21 DIAGNOSIS — M79641 Pain in right hand: Secondary | ICD-10-CM | POA: Diagnosis not present

## 2020-10-21 DIAGNOSIS — M542 Cervicalgia: Secondary | ICD-10-CM | POA: Diagnosis present

## 2020-10-21 DIAGNOSIS — W5582XA Struck by other mammals, initial encounter: Secondary | ICD-10-CM | POA: Diagnosis not present

## 2020-10-21 NOTE — ED Notes (Addendum)
Pt called 3x for room w/o response.  
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
## 2021-01-05 ENCOUNTER — Other Ambulatory Visit: Payer: Self-pay

## 2021-01-05 ENCOUNTER — Emergency Department
Admission: EM | Admit: 2021-01-05 | Discharge: 2021-01-05 | Disposition: A | Discharge status: Home / Self Care | Payer: Self-pay | Attending: Emergency Medicine | Admitting: Emergency Medicine

## 2021-01-05 ENCOUNTER — Encounter: Payer: Self-pay | Admitting: Emergency Medicine

## 2021-01-05 DIAGNOSIS — F17210 Nicotine dependence, cigarettes, uncomplicated: Secondary | ICD-10-CM | POA: Insufficient documentation

## 2021-01-05 DIAGNOSIS — K529 Noninfective gastroenteritis and colitis, unspecified: Secondary | ICD-10-CM | POA: Insufficient documentation

## 2021-01-05 LAB — COMPREHENSIVE METABOLIC PANEL
ALT: 12 U/L (ref 0–44)
AST: 23 U/L (ref 15–41)
Albumin: 4.1 g/dL (ref 3.5–5.0)
Alkaline Phosphatase: 69 U/L (ref 38–126)
Anion gap: 7 (ref 5–15)
BUN: 9 mg/dL (ref 6–20)
CO2: 22 mmol/L (ref 22–32)
Calcium: 9.4 mg/dL (ref 8.9–10.3)
Chloride: 108 mmol/L (ref 98–111)
Creatinine, Ser: 1.03 mg/dL — ABNORMAL HIGH (ref 0.44–1.00)
GFR, Estimated: >60 mL/min (ref >60)
Glucose, Bld: 106 mg/dL — ABNORMAL HIGH (ref 70–99)
Potassium: 4.3 mmol/L (ref 3.5–5.1)
Sodium: 137 mmol/L (ref 135–145)
Total Bilirubin: 0.5 mg/dL (ref 0.3–1.2)
Total Protein: 7.8 g/dL (ref 6.5–8.1)

## 2021-01-05 LAB — CBC
HCT: 39.7 % (ref 36.0–46.0)
Hemoglobin: 11.9 g/dL — ABNORMAL LOW (ref 12.0–15.0)
MCH: 22.6 pg — ABNORMAL LOW (ref 26.0–34.0)
MCHC: 30 g/dL (ref 30.0–36.0)
MCV: 75.3 fL — ABNORMAL LOW (ref 80.0–100.0)
Platelets: 385 10*3/uL (ref 150–400)
RBC: 5.27 MIL/uL — ABNORMAL HIGH (ref 3.87–5.11)
RDW: 17.8 % — ABNORMAL HIGH (ref 11.5–15.5)
WBC: 6 10*3/uL (ref 4.0–10.5)
nRBC: 0 % (ref 0.0–0.2)

## 2021-01-05 LAB — URINALYSIS, COMPLETE (UACMP) WITH MICROSCOPIC
Bacteria, UA: NONE SEEN
Bilirubin Urine: NEGATIVE
Glucose, UA: NEGATIVE mg/dL
Ketones, ur: 5 mg/dL — AB
Leukocytes,Ua: NEGATIVE
Nitrite: NEGATIVE
Protein, ur: 30 mg/dL — AB
Specific Gravity, Urine: 1.033 — ABNORMAL HIGH (ref 1.005–1.030)
WBC, UA: NONE SEEN WBC/hpf (ref 0–5)
pH: 5 (ref 5.0–8.0)

## 2021-01-05 LAB — POC URINE PREG, ED: Preg Test, Ur: NEGATIVE

## 2021-01-05 LAB — LIPASE, BLOOD: Lipase: 26 U/L (ref 11–51)

## 2021-01-05 MED ORDER — PROMETHAZINE HCL 12.5 MG PO TABS: 12.5000 mg | ORAL_TABLET | Freq: Four times a day (QID) | ORAL | 0 refills | Status: AC | PRN

## 2021-01-05 MED ORDER — ONDANSETRON 4 MG PO TBDP
4.0000 mg | ORAL_TABLET | Freq: Once | ORAL | Status: AC
  Administered 2021-01-05: 4 mg via ORAL
  Filled 2021-01-05: qty 1

## 2021-01-05 MED ORDER — ACETAMINOPHEN 500 MG PO TABS
1000.0000 mg | ORAL_TABLET | Freq: Once | ORAL | Status: AC
  Administered 2021-01-05: 1000 mg via ORAL
  Filled 2021-01-05: qty 2

## 2021-01-05 NOTE — ED Provider Notes (Signed)
None  [facility] Emergency Department Provider Note   ____________________________________________   Event Date/Time   First MD Initiated Contact with Patient 01/05/21 1753     (approximate)  I have reviewed the triage vital signs and the nursing notes.   HISTORY  Chief Complaint Nausea and Fever    HPI Latoya Malone is a 35 y.o. female with past medical history of bipolar disorder and depression who presents to the ED complaining of diarrhea and nausea.  Patient reports that she has had 2 days of diarrhea associated with nausea and one episode of vomiting earlier today.  She describes her stool as watery bloody.  She has not had any associated abdominal pain, dysuria, hematuria, vaginal bleeding, or vaginal discharge.  She describes subjective fevers and chills, but has not taken her temperature at home.  She has not taken any ibuprofen or Tylenol prior to arrival.  She denies any sick contacts, has not had any cough, chest pain, or shortness of breath.        Past Medical History:  Diagnosis Date  . Bipolar 1 disorder (HCC)   . Depression     There are no problems to display for this patient.   History reviewed. No pertinent surgical history.  Prior to Admission medications   Medication Sig Start Date End Date Taking? Authorizing Provider  promethazine (PHENERGAN) 12.5 MG tablet Take 1 tablet (12.5 mg total) by mouth every 6 (six) hours as needed for nausea or vomiting. 01/05/21  Yes Chesley Noon, MD  loperamide (IMODIUM A-D) 2 MG tablet Take 1 tablet (2 mg total) by mouth 4 (four) times daily as needed for diarrhea or loose stools. 09/19/15   Rockne Menghini, MD  moxifloxacin (VIGAMOX) 0.5 % ophthalmic solution Place 1 drop into the left eye 3 (three) times daily. 11/23/15   Payton Mccallum, MD  ondansetron (ZOFRAN ODT) 4 MG disintegrating tablet Take 1 tablet (4 mg total) by mouth every 8 (eight) hours as needed for nausea or vomiting.  09/19/15   Rockne Menghini, MD  traZODone (DESYREL) 50 MG tablet Take 50 mg by mouth at bedtime.    [provider]  traZODone (DESYREL) 50 MG tablet Take 25 mg by mouth 2 (two) times daily. In the morning and late afternoon    [provider]    Allergies Amoxicillin and Penicillins  History reviewed. No pertinent family history.  Social History Social History   Tobacco Use  . Smoking status: Current Every Day Smoker    Packs/day: 0.50    Types: Cigarettes  . Smokeless tobacco: Never Used  Vaping Use  . Vaping Use: Never used  Substance Use Topics  . Alcohol use: Yes    Comment: occasional  . Drug use: No    Review of Systems  Constitutional: Positive for subjective fever/chills Eyes: No visual changes. ENT: No sore throat. Cardiovascular: Denies chest pain. Respiratory: Denies shortness of breath. Gastrointestinal: No abdominal pain.  Positive for nausea, no vomiting.  Positive for diarrhea.  No constipation. Genitourinary: Negative for dysuria. Musculoskeletal: Negative for back pain. Skin: Negative for rash. Neurological: Negative for headaches, focal weakness or numbness.  ____________________________________________   PHYSICAL EXAM:  VITAL SIGNS: ED Triage Vitals  Enc Vitals Group     BP 01/05/21 1649 (!) 127/98     Pulse Rate 01/05/21 1649 (!) 115     Resp 01/05/21 1649 18     Temp 01/05/21 1649 98.9 F (37.2 C)     Temp Source  01/05/21 1649 Oral     SpO2 01/05/21 1649 100 %     Weight 01/05/21 1650 230 lb (104.3 kg)     Height 01/05/21 1650 5\' 4"  (1.626 m)     Head Circumference --      Peak Flow --      Pain Score 01/05/21 1649 1     Pain Loc --      Pain Edu? --      Excl. in GC? --     Constitutional: Alert and oriented. Eyes: Conjunctivae are normal. Head: Atraumatic. Nose: No congestion/rhinnorhea. Mouth/Throat: Mucous membranes are moist. Neck: Normal ROM Cardiovascular: Normal rate, regular rhythm. Grossly  normal heart sounds. Respiratory: Normal respiratory effort.  No retractions. Lungs CTAB. Gastrointestinal: Soft and nontender. No distention. Genitourinary: deferred Musculoskeletal: No lower extremity tenderness nor edema. Neurologic:  Normal speech and language. No gross focal neurologic deficits are appreciated. Skin:  Skin is warm, dry and intact. No rash noted. Psychiatric: Mood and affect are normal. Speech and behavior are normal.  ____________________________________________   LABS (all labs ordered are listed, but only abnormal results are displayed)  Labs Reviewed  COMPREHENSIVE METABOLIC PANEL - Abnormal; Notable for the following components:      Result Value   Glucose, Bld 106 (*)    Creatinine, Ser 1.03 (*)    All other components within normal limits  CBC - Abnormal; Notable for the following components:   RBC 5.27 (*)    Hemoglobin 11.9 (*)    MCV 75.3 (*)    MCH 22.6 (*)    RDW 17.8 (*)    All other components within normal limits  URINALYSIS, COMPLETE (UACMP) WITH MICROSCOPIC - Abnormal; Notable for the following components:   Color, Urine YELLOW (*)    APPearance TURBID (*)    Specific Gravity, Urine 1.033 (*)    Hgb urine dipstick MODERATE (*)    Ketones, ur 5 (*)    Protein, ur 30 (*)    All other components within normal limits  LIPASE, BLOOD  POC URINE PREG, ED    PROCEDURES  Procedure(s) performed (including Critical Care):  Procedures   ____________________________________________   INITIAL IMPRESSION / ASSESSMENT AND PLAN / ED COURSE       35 year old female with past medical history of bipolar disorder and depression who presents to the ED with 3 days of diarrhea and nausea with 1 episode of vomiting earlier today.  She denies any associated abdominal pain and does not have any tenderness on exam.  Labs are reassuring without significant dehydration or electrolyte abnormality.  No evidence of GI bleed at this time.  She was given Zofran  and is tolerating p.o. without difficulty.  Pregnancy testing is negative and UA shows no signs of infection.  Patient is appropriate for discharge home with PCP follow-up, was counseled to use Zofran and loperamide as needed.  She was counseled to return to the ED for new worsening symptoms, patient agrees with plan.  Patient feeling well and has been able to tolerate p.o. without difficulty.  She did develop a fever but there is no signs of bacterial illness at this time and I suspect she has a viral gastroenteritis.  She is appropriate for discharge home with PCP follow-up, may use Phenergan and Imodium as needed for symptomatic management.  She requests Phenergan rather than Zofran as she does not like the taste of Zofran.  She was counseled to return to the ED for new or worsening symptoms, patient  agrees with plan.      ____________________________________________   FINAL CLINICAL IMPRESSION(S) / ED DIAGNOSES  Final diagnoses:  Gastroenteritis     ED Discharge Orders         Ordered    promethazine (PHENERGAN) 12.5 MG tablet  Every 6 hours PRN        01/05/21 1921           Note:  This document was prepared using Dragon voice recognition software and may include unintentional dictation errors.   Chesley Noon, MD 01/05/21 Ernest[facility] [place]

## 2021-01-05 NOTE — ED Notes (Signed)
Pt now vomiting

## 2021-01-05 NOTE — ED Triage Notes (Signed)
Pt comes into the ED via POV c/o N/V/D and fever.  Pt denies being around anyone that she knows has COVID.  PT denies any abdominal pain, CP, or SHOB.  Pt ambulatory to triage and in NAD.

## 2021-01-05 NOTE — ED Notes (Addendum)
Pt to ED c/o NVD that started 2d ago. Only emesis was 0630 today. Diarrhea is "every 4 hours" and increases with movement. Has not eaten since yesterday around 1400. No vomiting except one time this morning, otherwise has only had nausea and diarrhea about 6 times per 24 hour period for last 2d. States felt feverish (warm in face) but did not take T at home. Pt is afebrile here. Pt in NAD. Denies covid exposure or possible pregnancy. Denies pain.

## 2021-01-05 NOTE — ED Notes (Signed)
Patient denies pain and is resting comfortably.  

## 2021-01-05 NOTE — ED Notes (Signed)
Dr. Larinda Buttery notified of pt's temp, see orders.

## 2021-08-05 IMAGING — CT CT CERVICAL SPINE W/O CM
3 of 4 series · 12 of 35 positions shown, 14 images · non-contrast
Comparison: None.

CLINICAL DATA: Trauma

EXAM:
CT HEAD WITHOUT CONTRAST
CT CERVICAL SPINE WITHOUT CONTRAST
TECHNIQUE: Multidetector CT imaging of the head and cervical spine was
performed following the standard protocol without intravenous
contrast. Multiplanar CT image reconstructions of the cervical spine
were also generated.

[Series 1: c_spine 2.0 st · axial · 0.25mm/px · z∈[-274,-152]mm · 4 of 93 slices shown, 5 images]
[im 16/93  soft-tissue]
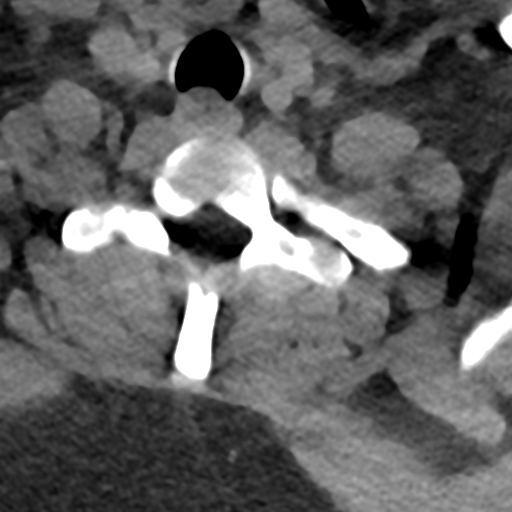
[im 16/93  bone]
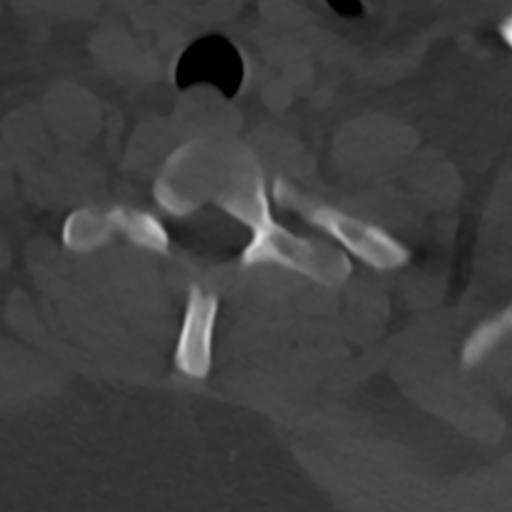
[im 31/93  bone]
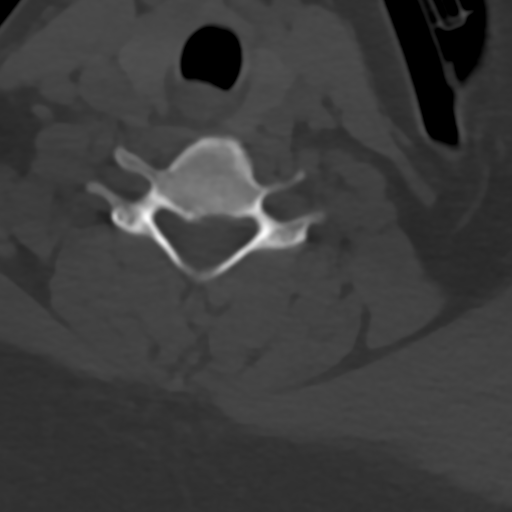
[im 62/93  bone]
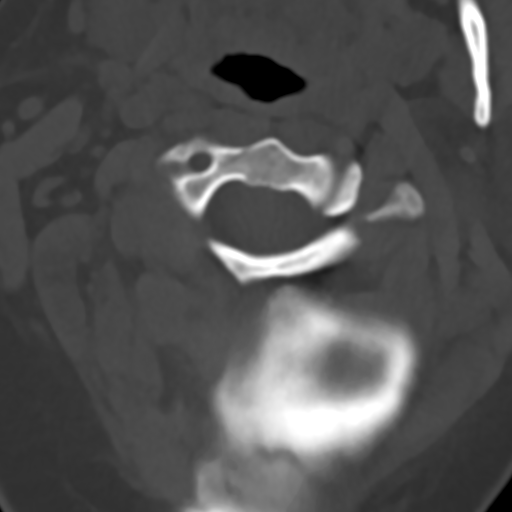
[im 77/93  bone]
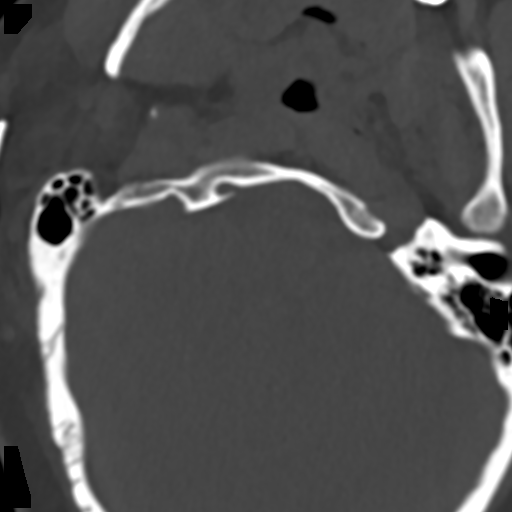

[Series 7: c_spine 2.0 sag bone · sagittal · 0.22mm/px · 5 of 61 slices shown, 6 images]
[im 21/61  bone]
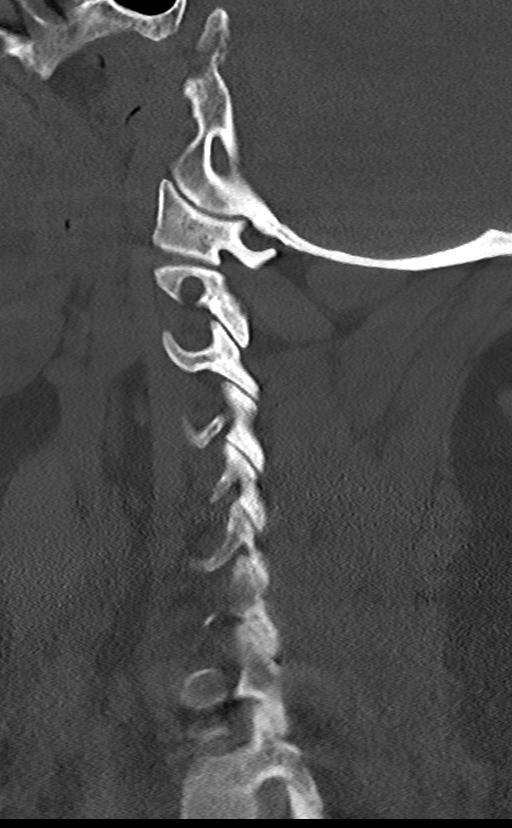
[im 26/61  bone]
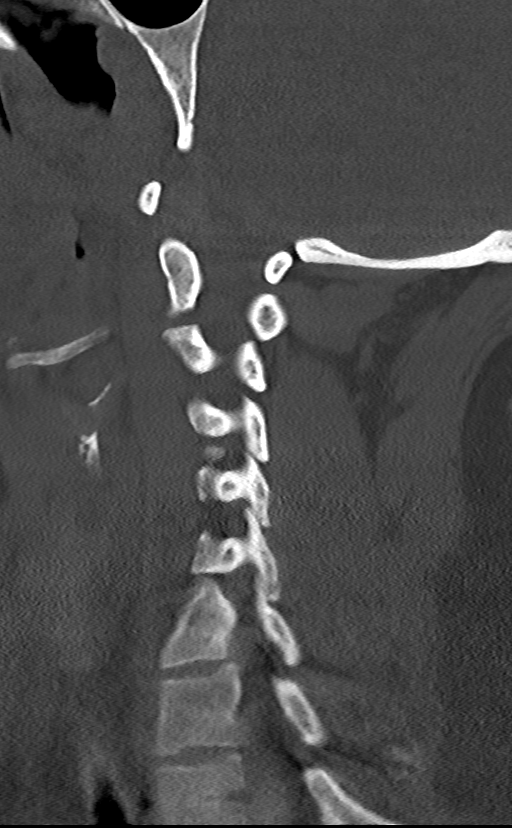
[im 31/61  soft-tissue]
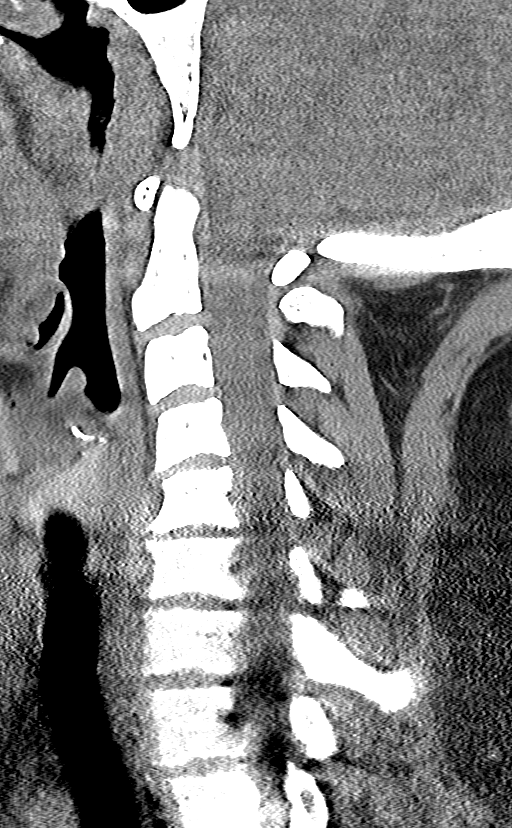
[im 31/61  bone]
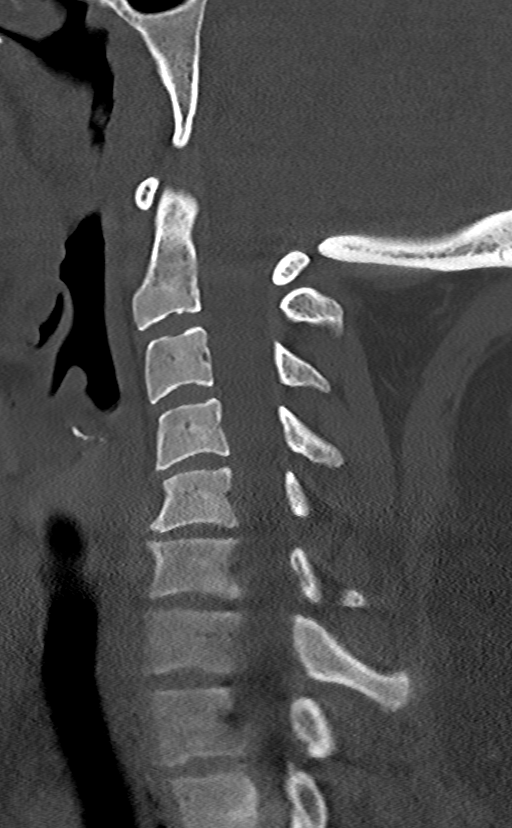
[im 36/61  bone]
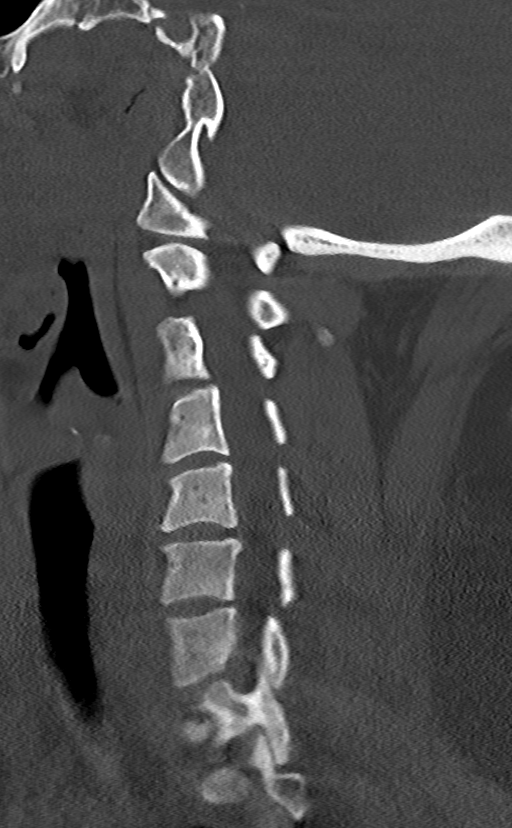
[im 41/61  bone]
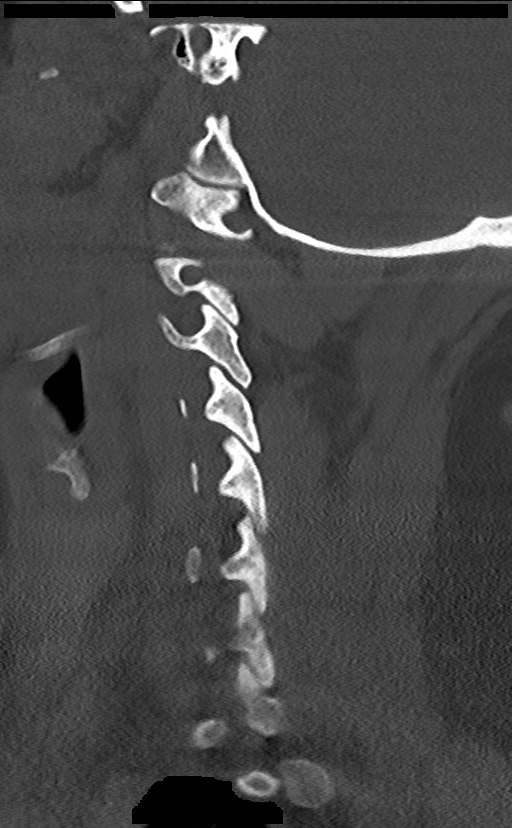

[Series 8: c_spine 2.0 cor bone · coronal · 0.27mm/px · 3 of 61 slices shown]
[im 13/61  bone]
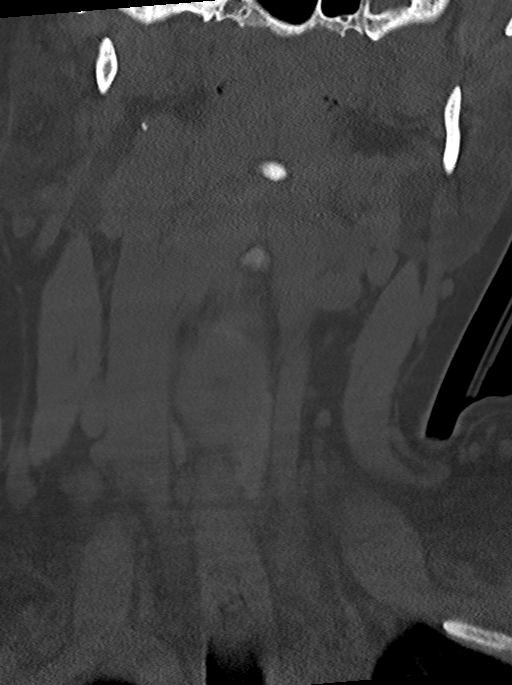
[im 25/61  bone]
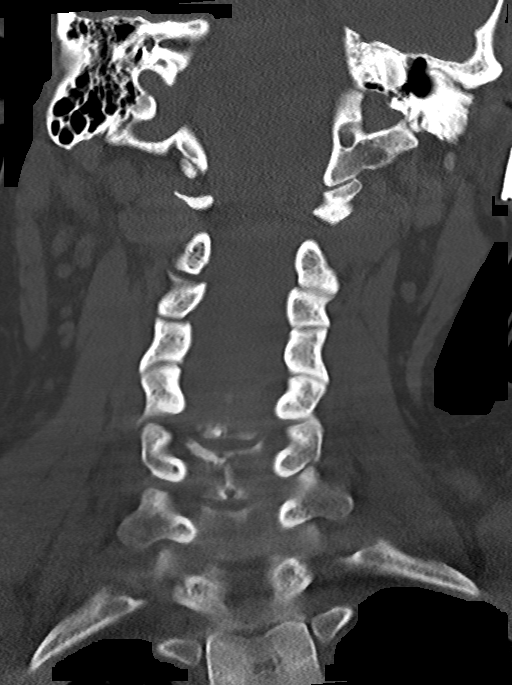
[im 37/61  bone]
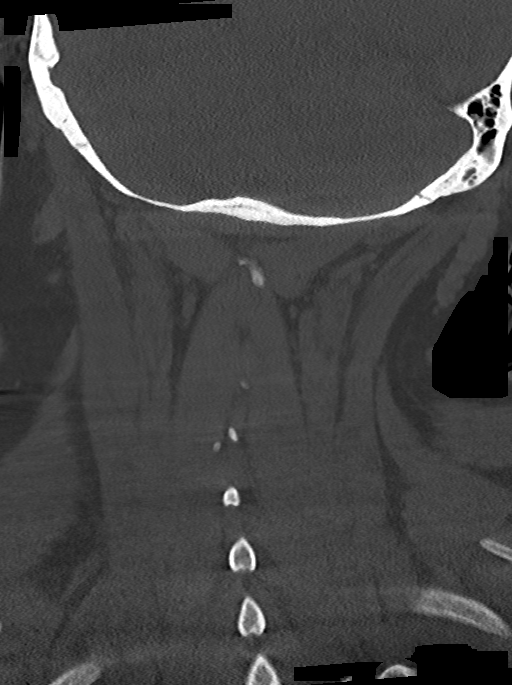

[12 of 35 positions shown; findings below may reference images not displayed]

FINDINGS: CT HEAD FINDINGS

Brain: There is no mass, hemorrhage or extra-axial collection. The
size and configuration of the ventricles and extra-axial CSF spaces
are normal. The brain parenchyma is normal, without evidence of
acute or chronic infarction.

Vascular: No abnormal hyperdensity of the major intracranial
arteries or dural venous sinuses. No intracranial atherosclerosis.

Skull: The visualized skull base, calvarium and extracranial soft
tissues are normal.

Sinuses/Orbits: No fluid levels or advanced mucosal thickening of
the visualized paranasal sinuses. No mastoid or middle ear effusion.
The orbits are normal.

CT CERVICAL SPINE FINDINGS

Alignment: No static subluxation. Facets are aligned. Occipital
condyles are normally positioned.

Skull base and vertebrae: No acute fracture.

Soft tissues and spinal canal: No prevertebral fluid or swelling. No
visible canal hematoma.

Disc levels: No advanced spinal canal or neural foraminal stenosis.

Upper chest: No pneumothorax, pulmonary nodule or pleural effusion.

Other: Normal visualized paraspinal cervical soft tissues.
IMPRESSION: 1. No acute intracranial abnormality.
2. No acute fracture or static subluxation of the cervical spine.

## 2021-08-05 IMAGING — CR DG HAND COMPLETE 3+V*R*
3 series · 3 of 3 positions shown · non-contrast
Comparison: None.

CLINICAL DATA: Pain

EXAM:
RIGHT HAND - COMPLETE 3+ VIEW

[hand pa]
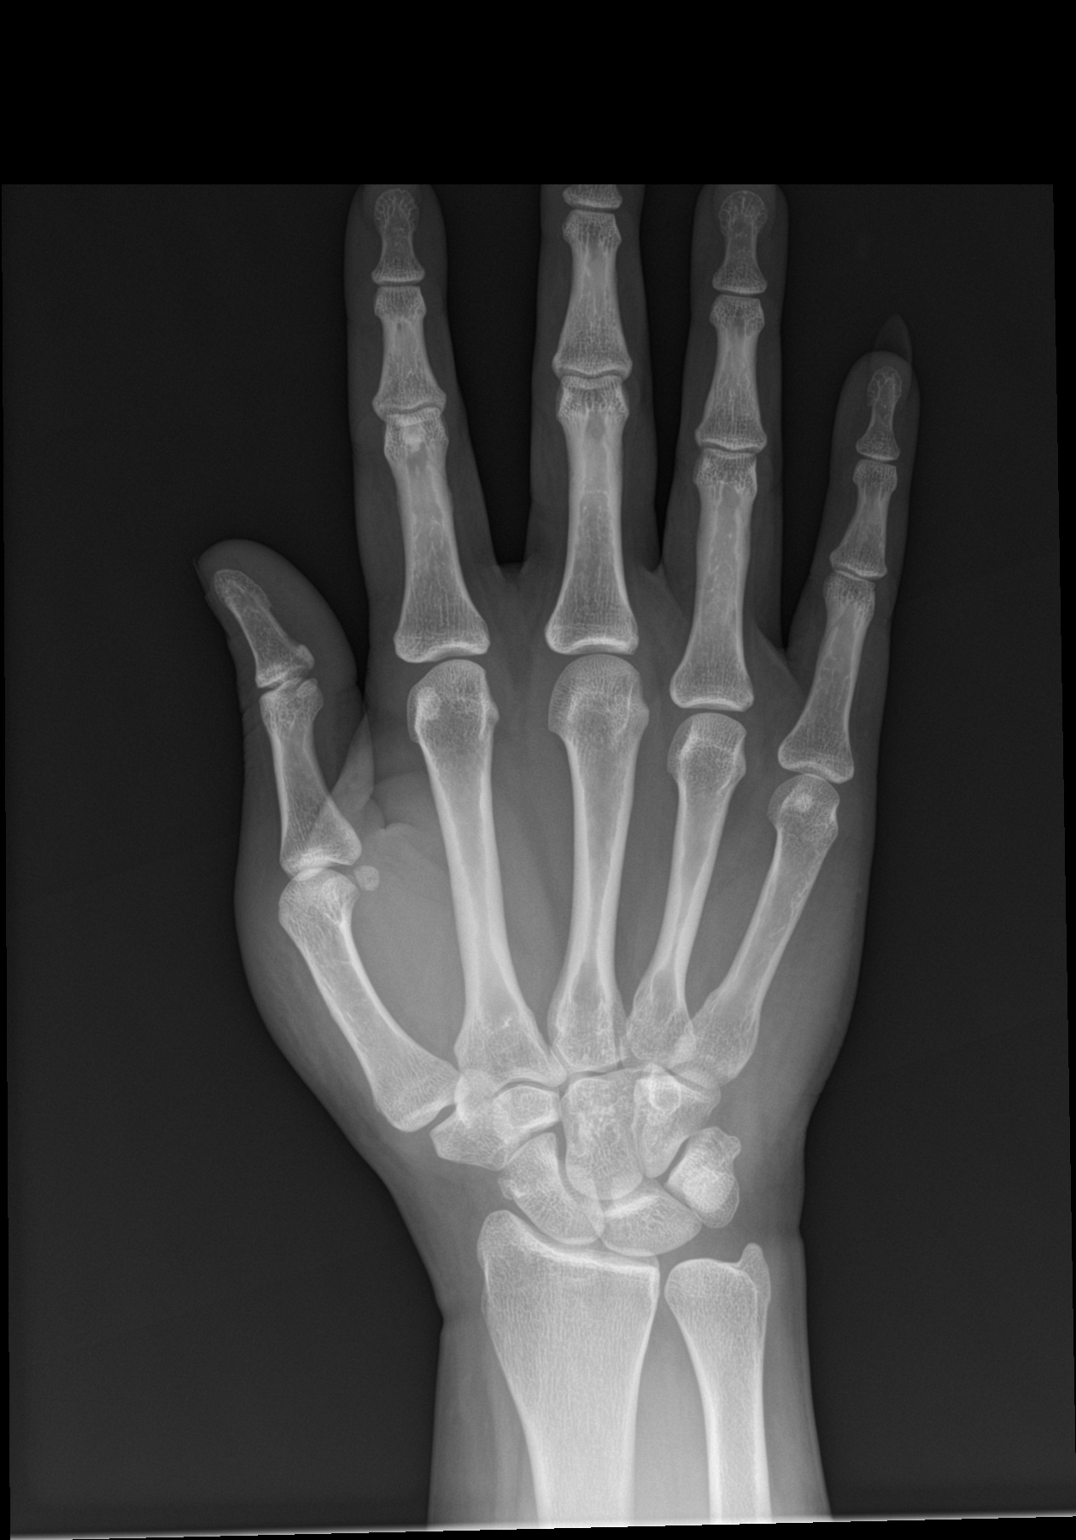

[hand obl]
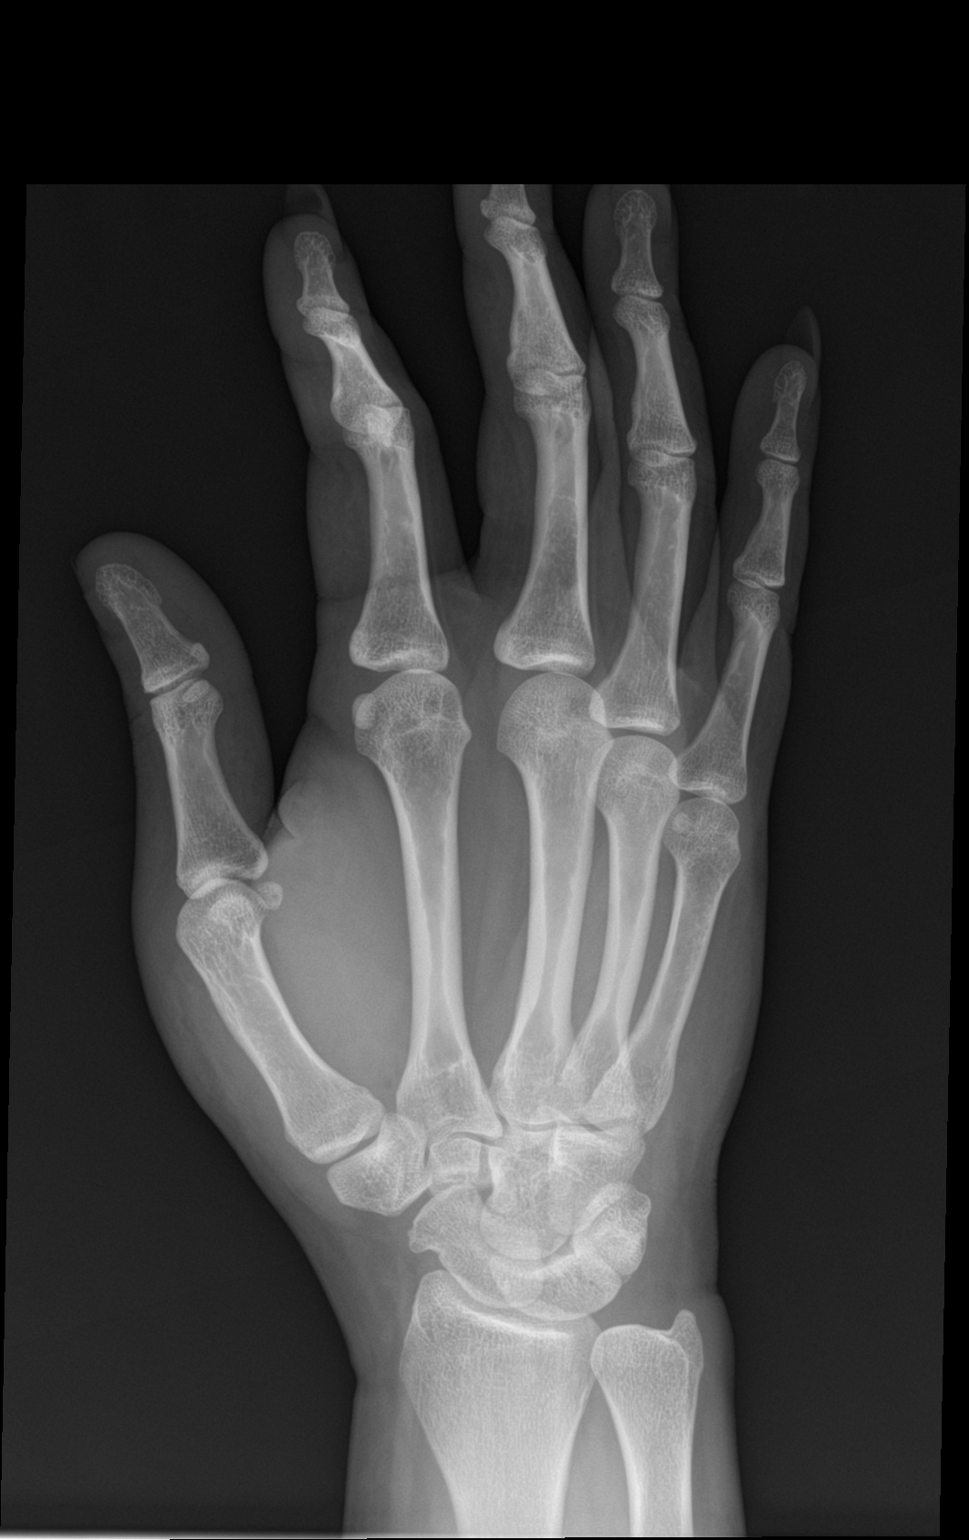

[hand lat]
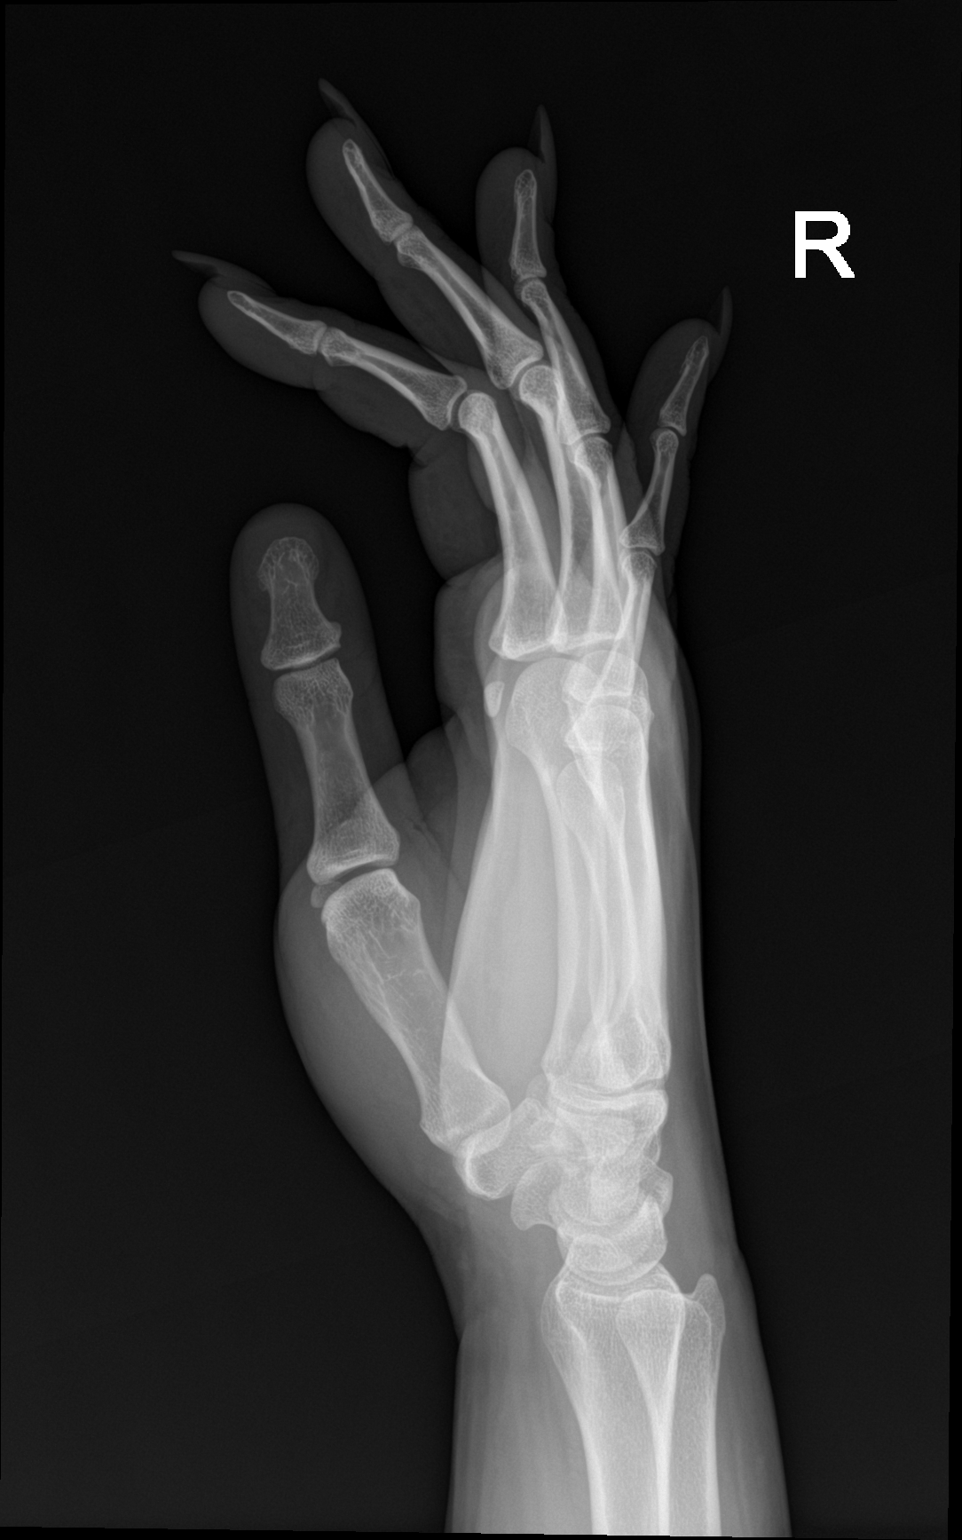

[3 of 3 positions shown; findings below may reference images not displayed]

FINDINGS: There is soft tissue swelling about the hand. There is no acute
displaced fracture or dislocation. There is no radiopaque foreign
body.
IMPRESSION: Soft tissue swelling without acute fracture or dislocation.
